# Patient Record
Sex: Female | Born: 2000 | Race: White | Hispanic: No | Marital: Single | Smoking: Never smoker
Health system: Southern US, Community
[De-identification: ages and names within clinical notes are randomized; demographics above are authoritative.]

## PROBLEM LIST (undated history)

## (undated) DIAGNOSIS — F419 Anxiety disorder, unspecified: Secondary | ICD-10-CM

## (undated) HISTORY — DX: Anxiety disorder, unspecified: F41.9

---
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
## 2020-10-05 ENCOUNTER — Telehealth: Payer: Self-pay | Admitting: Family Medicine

## 2020-10-05 NOTE — Telephone Encounter (Signed)
Mom would like to re-establish care please advise 

## 2020-10-05 NOTE — Telephone Encounter (Signed)
Ok to schedule re-est patient.  

## 2020-10-05 NOTE — Telephone Encounter (Signed)
Sure will see her

## 2020-10-05 NOTE — Telephone Encounter (Signed)
Virginia Acevedo is current patient of yours. Mother of this patient.  

## 2020-10-06 NOTE — Telephone Encounter (Signed)
lvm to schedule appt.  

## 2020-10-24 NOTE — Progress Notes (Addendum)
[facility] at [facility] [address] [place], [place] [address] [phone]  Date:  10/26/2020   Name:  Virginia Acevedo   DOB:  Jun 24, 2000   MRN:  [id number]  PCP:  Pearline Cables, MD    Chief Complaint: re-establishing care and right flank pain (Going on a couple of months. Sharp cramp like pain randomly at work. Mostly happens when she has to urinate. )   History of Present Illness:  Virginia Acevedo is a 20 y.o. very pleasant female patient who presents with the following:  Patient seen today to reestablish care, I last saw her in 2017 Her mom Carollee Herter is a current patient of mine She has history of exercise-induced asthma- she seems to have grown out of this however and no longer uses an inhaler  She is working at [employer] full time- she is saving money to go to [employer] to study finance   Review immunizations-patient would benefit from starting Gardasil, and can also receive Menveo ?  Contraception/sexual activity-she has never been sexually active, but is dating someone seriously and would like to start on contraception.  We discussed options, she is most interested in birth control pills No history of hypertension, no history of blood clots or cancer, no history of migraine headache with aura  LMP was last week She does not smoke, use drugs or drink alcohol   2-3 months ago she started to notice a pain in her right flank/ abdomen area-the pain will come and go generally within a few minutes  May occur more often when she needs to urinate- not related to menstrual cycle  No blood in her urine, no dysuria Always on the right side No vomiting or diarrhea  There are no problems to display for this patient.   Past Medical History:  Diagnosis Date   Anxiety       Social History   Tobacco Use   Smoking status: Never   Smokeless tobacco: Never  Substance Use Topics   Alcohol use: No    Alcohol/week: 0.0 standard drinks    Drug use: No    Family History  Problem Relation Age of Onset   Hypertension Mother    Diabetes Paternal Grandmother    Hypertension Maternal Grandmother    COPD Maternal Grandmother    Asthma Paternal Grandfather    Hyperlipidemia Father    Thyroid disease Father     No Known Allergies  Medication list has been reviewed and updated.  No current outpatient medications on file prior to visit.   No current facility-administered medications on file prior to visit.    Review of Systems:  As per HPI- otherwise negative.   Physical Examination: Vitals:   10/26/20 1532  BP: 104/60  Pulse: 95  Temp: 98.6 F (37 C)  SpO2: 99%   Vitals:   10/26/20 1532  Weight: 99 lb 3.2 oz (45 kg)  Height: 5\' 1"  (1.549 m)   Body mass index is 18.74 kg/m. Ideal Body Weight: Weight in (lb) to have BMI = 25: 132  GEN: no acute distress. Slim build, looks well  HEENT: Atraumatic, Normocephalic.  Bilateral TM wnl, oropharynx normal.  PEERL,EOMI.   Ears and Nose: No external deformity. CV: RRR, No M/G/R. No JVD. No thrill. No extra heart sounds. PULM: CTA B, no wheezes, crackles, rhonchi. No retractions. No resp. distress. No accessory muscle use. ABD: S, NT, ND, +BS. No rebound. No HSM.  No  CVA tenderness, belly is benign EXTR: No c/c/e PSYCH: Normally interactive. Conversant.  Results for orders placed or performed in visit on 10/26/20  POCT urine pregnancy  Result Value Ref Range   Preg Test, Ur Negative Negative  POCT urinalysis dipstick  Result Value Ref Range   Color, UA yellow yellow   Clarity, UA cloudy (A) clear   Glucose, UA negative negative mg/dL   Bilirubin, UA negative negative   Ketones, POC UA negative negative mg/dL   Spec Grav, UA 1.157 2.620 - 1.025   Blood, UA negative negative   pH, UA 7.5 5.0 - 8.0   Protein Ur, POC =30 (A) negative mg/dL   Urobilinogen, UA 0.2 0.2 or 1.0 E.U./dL   Nitrite, UA Negative Negative   Leukocytes, UA Negative Negative      Assessment and Plan: Physical exam  Right flank pain - Plan: Comprehensive metabolic panel, Urine Culture, POCT urinalysis dipstick  Screening for deficiency anemia - Plan: CBC  Screening for diabetes mellitus - Plan: Comprehensive metabolic panel  Encounter for initial prescription of contraceptive pills - Plan: POCT urine pregnancy, levonorgestrel-ethinyl estradiol (ALESSE) 0.1-20 MG-MCG tablet  Immunization due - Plan: HPV 9-valent vaccine,Recombinat, Meningococcal MCV4O(Menveo) Patient seen today for a physical and to reestablish care She is having flank pain, this could indicate a kidney stone.  We will send her urine for culture.  Assuming this is negative, can pursue imaging-CT versus ultrasound-to look for stone She would like to start on contraception.  I prescribed oral contraceptive pills, discussed with patient how to properly and safely use pills and discussed most frequent side effects.  She will contact me if any concerns  Gave first dose of Gardasil, Menactra today.  Since she is 20 one dose of Menactra is sufficient, but I did encourage 2 more doses of Gardasil Will plan further follow- up pending labs.   This visit occurred during the SARS-CoV-2 public health emergency.  Safety protocols were in place, including screening questions prior to the visit, additional usage of staff PPE, and extensive cleaning of exam room while observing appropriate contact time as indicated for disinfecting solutions.   Signed Abbe Amsterdam, MD  Addendum 6/28, received her labs as below-await urine culture Results for orders placed or performed in visit on 10/26/20  CBC  Result Value Ref Range   WBC 6.9 4.5 - 10.5 K/uL   RBC 4.45 3.87 - 5.11 Mil/uL   Platelets 272.0 150.0 - 400.0 K/uL   Hemoglobin 13.2 12.0 - 15.0 g/dL   HCT 35.5 97.4 - 16.3 %   MCV 88.3 78.0 - 100.0 fl   MCHC 33.5 30.0 - 36.0 g/dL   RDW 84.5 36.4 - 68.0 %  Comprehensive metabolic panel  Result Value Ref  Range   Sodium 139 135 - 145 mEq/L   Potassium 3.6 3.5 - 5.1 mEq/L   Chloride 103 96 - 112 mEq/L   CO2 27 19 - 32 mEq/L   Glucose, Bld 89 70 - 99 mg/dL   BUN 10 6 - 23 mg/dL   Creatinine, Ser 3.21 0.40 - 1.20 mg/dL   Total Bilirubin 0.4 0.2 - 1.2 mg/dL   Alkaline Phosphatase 61 39 - 117 U/L   AST 18 0 - 37 U/L   ALT 13 0 - 35 U/L   Total Protein 7.1 6.0 - 8.3 g/dL   Albumin 4.4 3.5 - 5.2 g/dL   GFR 224.82 >50.03 mL/min   Calcium 9.6 8.4 - 10.5 mg/dL  POCT urine pregnancy  Result Value Ref Range   Preg Test, Ur Negative Negative  POCT urinalysis dipstick  Result Value Ref Range   Color, UA yellow yellow   Clarity, UA cloudy (A) clear   Glucose, UA negative negative mg/dL   Bilirubin, UA negative negative   Ketones, POC UA negative negative mg/dL   Spec Grav, UA 1.610 9.604 - 1.025   Blood, UA negative negative   pH, UA 7.5 5.0 - 8.0   Protein Ur, POC =30 (A) negative mg/dL   Urobilinogen, UA 0.2 0.2 or 1.0 E.U./dL   Nitrite, UA Negative Negative   Leukocytes, UA Negative Negative

## 2020-10-26 ENCOUNTER — Other Ambulatory Visit: Payer: Self-pay

## 2020-10-26 ENCOUNTER — Ambulatory Visit (INDEPENDENT_AMBULATORY_CARE_PROVIDER_SITE_OTHER): Payer: BC Managed Care – PPO | Admitting: Family Medicine

## 2020-10-26 ENCOUNTER — Encounter: Payer: Self-pay | Admitting: Family Medicine

## 2020-10-26 VITALS — BP 104/60 | HR 95 | Temp 98.6°F | Ht 61.0 in | Wt 99.2 lb

## 2020-10-26 DIAGNOSIS — Z131 Encounter for screening for diabetes mellitus: Secondary | ICD-10-CM

## 2020-10-26 DIAGNOSIS — Z30011 Encounter for initial prescription of contraceptive pills: Secondary | ICD-10-CM | POA: Diagnosis not present

## 2020-10-26 DIAGNOSIS — Z23 Encounter for immunization: Secondary | ICD-10-CM

## 2020-10-26 DIAGNOSIS — Z0000 Encounter for general adult medical examination without abnormal findings: Secondary | ICD-10-CM

## 2020-10-26 DIAGNOSIS — R109 Unspecified abdominal pain: Secondary | ICD-10-CM | POA: Diagnosis not present

## 2020-10-26 DIAGNOSIS — Z130 Encounter for screening for diseases of the blood and blood-forming organs and certain disorders involving the immune mechanism: Secondary | ICD-10-CM | POA: Diagnosis not present

## 2020-10-26 LAB — POCT URINALYSIS DIP (MANUAL ENTRY)
Bilirubin, UA: NEGATIVE
Blood, UA: NEGATIVE
Glucose, UA: NEGATIVE mg/dL
Ketones, POC UA: NEGATIVE mg/dL
Leukocytes, UA: NEGATIVE
Nitrite, UA: NEGATIVE
Protein Ur, POC: 30 mg/dL — AB
Spec Grav, UA: 1.01 (ref 1.010–1.025)
Urobilinogen, UA: 0.2 E.U./dL
pH, UA: 7.5 (ref 5.0–8.0)

## 2020-10-26 LAB — POCT URINE PREGNANCY: Preg Test, Ur: NEGATIVE

## 2020-10-26 MED ORDER — LEVONORGESTREL-ETHINYL ESTRAD 0.1-20 MG-MCG PO TABS: 1.0000 | ORAL_TABLET | Freq: Every day | ORAL | 3 refills | Status: DC

## 2020-10-26 NOTE — Patient Instructions (Addendum)
It was very nice to see you today, I will be in touch with your labs and symptoms possible.  Assuming that you do not appear to have a urinary tract infection, we may want to do imaging to look for possible kidney stone.  We could do a either ultrasound or noncontrast CT-the benefit of ultrasound is that it does not involve any radiation, however a CT scan likely gives [place] more detail  You got a dose of gardasil today, and 1 dose of meningitis vaccine today.  You can start on the birth control pills now, please let me know how they work for you  I am not quite sure if you had a Gardasil in the past- if this is your first dose, you will want a 2nd dose in 1-2 months.  3rd dose is given 5- 6 months after the 2nd dose

## 2020-10-27 LAB — URINE CULTURE
MICRO NUMBER:: 12053730
Result:: NO GROWTH
SPECIMEN QUALITY:: ADEQUATE

## 2020-10-27 LAB — COMPREHENSIVE METABOLIC PANEL
ALT: 13 U/L (ref 0–35)
AST: 18 U/L (ref 0–37)
Albumin: 4.4 g/dL (ref 3.5–5.2)
Alkaline Phosphatase: 61 U/L (ref 39–117)
BUN: 10 mg/dL (ref 6–23)
CO2: 27 mEq/L (ref 19–32)
Calcium: 9.6 mg/dL (ref 8.4–10.5)
Chloride: 103 mEq/L (ref 96–112)
Creatinine, Ser: 0.8 mg/dL (ref 0.40–1.20)
GFR: 106.23 mL/min (ref >60.00)
Glucose, Bld: 89 mg/dL (ref 70–99)
Potassium: 3.6 mEq/L (ref 3.5–5.1)
Sodium: 139 mEq/L (ref 135–145)
Total Bilirubin: 0.4 mg/dL (ref 0.2–1.2)
Total Protein: 7.1 g/dL (ref 6.0–8.3)

## 2020-10-27 LAB — CBC
HCT: 39.3 % (ref 36.0–46.0)
Hemoglobin: 13.2 g/dL (ref 12.0–15.0)
MCHC: 33.5 g/dL (ref 30.0–36.0)
MCV: 88.3 fl (ref 78.0–100.0)
Platelets: 272 10*3/uL (ref 150.0–400.0)
RBC: 4.45 Mil/uL (ref 3.87–5.11)
RDW: 13 % (ref 11.5–14.6)
WBC: 6.9 10*3/uL (ref 4.5–10.5)

## 2020-10-29 ENCOUNTER — Other Ambulatory Visit: Payer: Self-pay | Admitting: Family

## 2020-10-29 DIAGNOSIS — R109 Unspecified abdominal pain: Secondary | ICD-10-CM

## 2020-10-29 NOTE — Telephone Encounter (Signed)
Spoke with patient's mother , she stated she is going to call the insurance company about pricing and etc , and then she will call our office back

## 2020-10-29 NOTE — Telephone Encounter (Signed)
Patient's mother called back after speaking with her insurance company and stated they would like to proceed with a CT to be scheduled downstairs because it will cover at 100%.

## 2020-10-30 ENCOUNTER — Ambulatory Visit (HOSPITAL_BASED_OUTPATIENT_CLINIC_OR_DEPARTMENT_OTHER)
Admission: RE | Admit: 2020-10-30 | Discharge: 2020-10-30 | Disposition: A | Discharge status: Home / Self Care | Payer: BC Managed Care – PPO | Source: Ambulatory Visit | Attending: Family | Admitting: Family

## 2020-10-30 ENCOUNTER — Other Ambulatory Visit: Payer: Self-pay

## 2020-10-30 DIAGNOSIS — R109 Unspecified abdominal pain: Secondary | ICD-10-CM | POA: Insufficient documentation

## 2020-10-30 DIAGNOSIS — N8301 Follicular cyst of right ovary: Secondary | ICD-10-CM | POA: Diagnosis not present

## 2020-10-30 DIAGNOSIS — Q433 Congenital malformations of intestinal fixation: Secondary | ICD-10-CM | POA: Diagnosis not present

## 2020-10-30 NOTE — Telephone Encounter (Signed)
Thank you Vernona Rieger!  Please feel free to route CT to me when it comes in   Thanks again

## 2020-11-04 ENCOUNTER — Encounter: Payer: Self-pay | Admitting: Family Medicine

## 2020-11-11 DIAGNOSIS — L404 Guttate psoriasis: Secondary | ICD-10-CM | POA: Diagnosis not present

## 2020-11-11 DIAGNOSIS — L249 Irritant contact dermatitis, unspecified cause: Secondary | ICD-10-CM | POA: Diagnosis not present

## 2021-02-01 ENCOUNTER — Encounter: Payer: Self-pay | Admitting: Family Medicine

## 2021-02-01 DIAGNOSIS — R1011 Right upper quadrant pain: Secondary | ICD-10-CM

## 2021-02-02 NOTE — Addendum Note (Signed)
Addended by: Abbe Amsterdam C on: 02/02/2021 05:35 PM   Modules accepted: Orders

## 2021-02-04 DIAGNOSIS — G8929 Other chronic pain: Secondary | ICD-10-CM | POA: Diagnosis not present

## 2021-02-04 DIAGNOSIS — R1011 Right upper quadrant pain: Secondary | ICD-10-CM | POA: Diagnosis not present

## 2021-02-10 ENCOUNTER — Encounter: Payer: Self-pay | Admitting: Family Medicine

## 2021-06-15 NOTE — Progress Notes (Deleted)
[facility] at [facility] [address] [place], [place] [address] [phone]  Date:  06/16/2021   Name:  Virginia Acevedo   DOB:  January 15, 2001   MRN:  [id number]  PCP:  Pearline Cables, MD    Chief Complaint: No chief complaint on file.   History of Present Illness:  Virginia Acevedo is a 21 y.o. very pleasant female patient who presents with the following:  Patient seen today with concern of abdominal pain Most recent visit with myself was in June for a physical At that time she was reestablishing care, her mom is also my patient We started on contraceptive pills at that time  At that visit she also had complaint of intermittent right flank pain for 2 to 3 months We ended up getting a CT renal stone study in July which was negative She also got a right upper quadrant ultrasound in October, also negative  Needs second dose Gardasil  There are no problems to display for this patient.   Past Medical History:  Diagnosis Date   Anxiety     *** The histories are not reviewed yet. Please review them in the "History" navigator section and refresh this SmartLink.  Social History   Tobacco Use   Smoking status: Never   Smokeless tobacco: Never  Substance Use Topics   Alcohol use: No    Alcohol/week: 0.0 standard drinks   Drug use: No    Family History  Problem Relation Age of Onset   Hypertension Mother    Diabetes Paternal Grandmother    Hypertension Maternal Grandmother    COPD Maternal Grandmother    Asthma Paternal Grandfather    Hyperlipidemia Father    Thyroid disease Father     No Known Allergies  Medication list has been reviewed and updated.  Current Outpatient Medications on File Prior to Visit  Medication Sig Dispense Refill   levonorgestrel-ethinyl estradiol (ALESSE) 0.1-20 MG-MCG tablet Take 1 tablet by mouth daily. 84 tablet 3   No current facility-administered medications on file prior to visit.     Review of Systems:  As per HPI- otherwise negative.   Physical Examination: There were no vitals filed for this visit. There were no vitals filed for this visit. There is no height or weight on file to calculate BMI. Ideal Body Weight:    GEN: no acute distress. HEENT: Atraumatic, Normocephalic.  Ears and Nose: No external deformity. CV: RRR, No M/G/R. No JVD. No thrill. No extra heart sounds. PULM: CTA B, no wheezes, crackles, rhonchi. No retractions. No resp. distress. No accessory muscle use. ABD: S, NT, ND, +BS. No rebound. No HSM. EXTR: No c/c/e PSYCH: Normally interactive. Conversant.    Assessment and Plan: ***  Signed Abbe Amsterdam, MD

## 2021-06-16 ENCOUNTER — Ambulatory Visit: Payer: BC Managed Care – PPO | Admitting: Family Medicine

## 2021-10-13 ENCOUNTER — Other Ambulatory Visit: Payer: Self-pay

## 2021-10-13 DIAGNOSIS — Z30011 Encounter for initial prescription of contraceptive pills: Secondary | ICD-10-CM

## 2021-10-13 MED ORDER — LEVONORGESTREL-ETHINYL ESTRAD 0.1-20 MG-MCG PO TABS: 1.0000 | ORAL_TABLET | Freq: Every day | ORAL | 0 refills | Status: DC

## 2021-10-15 ENCOUNTER — Other Ambulatory Visit: Payer: Self-pay | Admitting: Family Medicine

## 2021-10-15 DIAGNOSIS — Z30011 Encounter for initial prescription of contraceptive pills: Secondary | ICD-10-CM

## 2021-10-17 ENCOUNTER — Other Ambulatory Visit: Payer: Self-pay | Admitting: Family Medicine

## 2021-10-17 DIAGNOSIS — Z30011 Encounter for initial prescription of contraceptive pills: Secondary | ICD-10-CM

## 2021-10-18 ENCOUNTER — Telehealth: Payer: Self-pay | Admitting: Family Medicine

## 2021-10-18 NOTE — Telephone Encounter (Signed)
Patient notified that rx has been sent to [facility].

## 2021-10-18 NOTE — Telephone Encounter (Signed)
Carollee Herter (mother DPR OK) called stating pt would like to have her default pharmacy changed to the following:  [facility] [address] [place], [place] [address] Phone: [phone]

## 2021-11-16 DIAGNOSIS — Z309 Encounter for contraceptive management, unspecified: Secondary | ICD-10-CM | POA: Insufficient documentation

## 2021-11-16 NOTE — Progress Notes (Deleted)
[facility] at [facility] [address] [place], [place] [address] [phone]  Date:  11/22/2021   Name:  Virginia Acevedo   DOB:  02/01/2001   MRN:  [id number]  PCP:  Pearline Cables, MD    Chief Complaint: No chief complaint on file.   History of Present Illness:  Virginia Acevedo is a 21 y.o. very pleasant female patient who presents with the following:  Generally healthy young woman seen today for physical exam Most recent visit with myself about 1 year ago Previous history of exercise-induced asthma, no longer bothersome At last visit she was considering becoming sexually active, we started on birth control pills  Can give dose of Gardasil Can start Pap today? Blood work 1 year ago Patient Active Problem List   Diagnosis Date Noted   Contraceptive management 11/16/2021    Past Medical History:  Diagnosis Date   Anxiety     *** The histories are not reviewed yet. Please review them in the "History" navigator section and refresh this SmartLink.  Social History   Tobacco Use   Smoking status: Never   Smokeless tobacco: Never  Substance Use Topics   Alcohol use: No    Alcohol/week: 0.0 standard drinks of alcohol   Drug use: No    Family History  Problem Relation Age of Onset   Hypertension Mother    Diabetes Paternal Grandmother    Hypertension Maternal Grandmother    COPD Maternal Grandmother    Asthma Paternal Grandfather    Hyperlipidemia Father    Thyroid disease Father     No Known Allergies  Medication list has been reviewed and updated.  Current Outpatient Medications on File Prior to Visit  Medication Sig Dispense Refill   LESSINA-28 0.1-20 MG-MCG tablet TAKE ONE TABLET BY MOUTH ONE TIME DAILY 84 tablet 3   No current facility-administered medications on file prior to visit.    Review of Systems:  As per HPI- otherwise negative.   Physical Examination: There were no vitals filed for  this visit. There were no vitals filed for this visit. There is no height or weight on file to calculate BMI. Ideal Body Weight:    GEN: no acute distress. HEENT: Atraumatic, Normocephalic.  Ears and Nose: No external deformity. CV: RRR, No M/G/R. No JVD. No thrill. No extra heart sounds. PULM: CTA B, no wheezes, crackles, rhonchi. No retractions. No resp. distress. No accessory muscle use. ABD: S, NT, ND, +BS. No rebound. No HSM. EXTR: No c/c/e PSYCH: Normally interactive. Conversant.    Assessment and Plan: *** Physical exam.  Encouraged healthy diet and exercise routine Signed Abbe Amsterdam, MD

## 2021-11-22 ENCOUNTER — Encounter: Payer: BC Managed Care – PPO | Admitting: Family Medicine

## 2021-12-20 NOTE — Progress Notes (Unsigned)
[facility] at [facility] [address], [address] [place], [place] [address] [phone]  Date:  12/22/2021   Name:  Virginia Acevedo   DOB:  2001/03/27   MRN:  [id number]  PCP:  Pearline Cables, MD    Chief Complaint: No chief complaint on file.   History of Present Illness:  Virginia Acevedo is a 21 y.o. very pleasant female patient who presents with the following:  Patient seen today to discuss side pain-last visit with myself about 1 year ago for a physical I believe we most recently discussed this in October of last year, she had a right upper quadrant ultrasound per [facility] which was negative  Patient Active Problem List   Diagnosis Date Noted   Contraceptive management 11/16/2021    Past Medical History:  Diagnosis Date   Anxiety     *** The histories are not reviewed yet. Please review them in the "History" navigator section and refresh this SmartLink.  Social History   Tobacco Use   Smoking status: Never   Smokeless tobacco: Never  Substance Use Topics   Alcohol use: No    Alcohol/week: 0.0 standard drinks of alcohol   Drug use: No    Family History  Problem Relation Age of Onset   Hypertension Mother    Diabetes Paternal Grandmother    Hypertension Maternal Grandmother    COPD Maternal Grandmother    Asthma Paternal Grandfather    Hyperlipidemia Father    Thyroid disease Father     No Known Allergies  Medication list has been reviewed and updated.  Current Outpatient Medications on File Prior to Visit  Medication Sig Dispense Refill   LESSINA-28 0.1-20 MG-MCG tablet TAKE ONE TABLET BY MOUTH ONE TIME DAILY 84 tablet 3   No current facility-administered medications on file prior to visit.    Review of Systems:  As per HPI- otherwise negative.   Physical Examination: There were no vitals filed for this visit. There were no vitals filed for this visit. There is no height or weight on file to calculate  BMI. Ideal Body Weight:    GEN: no acute distress. HEENT: Atraumatic, Normocephalic.  Ears and Nose: No external deformity. CV: RRR, No M/G/R. No JVD. No thrill. No extra heart sounds. PULM: CTA B, no wheezes, crackles, rhonchi. No retractions. No resp. distress. No accessory muscle use. ABD: S, NT, ND, +BS. No rebound. No HSM. EXTR: No c/c/e PSYCH: Normally interactive. Conversant.    Assessment and Plan: ***  Signed Abbe Amsterdam, MD

## 2021-12-22 ENCOUNTER — Ambulatory Visit (INDEPENDENT_AMBULATORY_CARE_PROVIDER_SITE_OTHER): Payer: Self-pay | Admitting: Family Medicine

## 2021-12-22 ENCOUNTER — Ambulatory Visit (HOSPITAL_BASED_OUTPATIENT_CLINIC_OR_DEPARTMENT_OTHER)
Admission: RE | Admit: 2021-12-22 | Discharge: 2021-12-22 | Disposition: A | Discharge status: Home / Self Care | Payer: Self-pay | Source: Ambulatory Visit | Attending: Family Medicine | Admitting: Family Medicine

## 2021-12-22 ENCOUNTER — Encounter: Payer: Self-pay | Admitting: Family Medicine

## 2021-12-22 VITALS — BP 110/60 | HR 75 | Temp 97.8°F | Resp 18 | Ht 61.0 in | Wt 101.4 lb

## 2021-12-22 DIAGNOSIS — R1011 Right upper quadrant pain: Secondary | ICD-10-CM

## 2021-12-22 DIAGNOSIS — Z30011 Encounter for initial prescription of contraceptive pills: Secondary | ICD-10-CM

## 2021-12-22 MED ORDER — LEVONORGESTREL-ETHINYL ESTRAD 0.1-20 MG-MCG PO TABS: 1.0000 | ORAL_TABLET | Freq: Every day | ORAL | 3 refills | Status: AC

## 2021-12-22 MED ORDER — ALBUTEROL SULFATE HFA 108 (90 BASE) MCG/ACT IN AERS: 2.0000 | INHALATION_SPRAY | Freq: Four times a day (QID) | RESPIRATORY_TRACT | 3 refills | Status: AC | PRN

## 2021-12-22 NOTE — Patient Instructions (Signed)
Good to see you today!  I will be in touch with your labs asap We will set up an ultrasound of your gallbladder asap I also ordered a chest x-ray for you Let me know if anything is changing or getting worse Try to eat a low in fat diet until we figure out if a gallbladder problem

## 2021-12-23 ENCOUNTER — Encounter: Payer: Self-pay | Admitting: Family Medicine

## 2021-12-23 LAB — CBC
HCT: 39.1 % (ref 36.0–46.0)
Hemoglobin: 13.3 g/dL (ref 12.0–15.0)
MCHC: 34.1 g/dL (ref 30.0–36.0)
MCV: 90.1 fl (ref 78.0–100.0)
Platelets: 252 10*3/uL (ref 150.0–400.0)
RBC: 4.34 Mil/uL (ref 3.87–5.11)
RDW: 12.5 % (ref 11.5–15.5)
WBC: 5.6 10*3/uL (ref 4.0–10.5)

## 2021-12-23 LAB — COMPREHENSIVE METABOLIC PANEL
ALT: 10 U/L (ref 0–35)
AST: 16 U/L (ref 0–37)
Albumin: 4.2 g/dL (ref 3.5–5.2)
Alkaline Phosphatase: 47 U/L (ref 39–117)
BUN: 9 mg/dL (ref 6–23)
CO2: 24 mEq/L (ref 19–32)
Calcium: 9 mg/dL (ref 8.4–10.5)
Chloride: 106 mEq/L (ref 96–112)
Creatinine, Ser: 0.8 mg/dL (ref 0.40–1.20)
GFR: 105.37 mL/min (ref >60.00)
Glucose, Bld: 85 mg/dL (ref 70–99)
Potassium: 3.8 mEq/L (ref 3.5–5.1)
Sodium: 137 mEq/L (ref 135–145)
Total Bilirubin: 0.5 mg/dL (ref 0.2–1.2)
Total Protein: 7.3 g/dL (ref 6.0–8.3)

## 2021-12-24 ENCOUNTER — Telehealth (HOSPITAL_BASED_OUTPATIENT_CLINIC_OR_DEPARTMENT_OTHER): Payer: Self-pay

## 2022-01-04 ENCOUNTER — Telehealth (HOSPITAL_BASED_OUTPATIENT_CLINIC_OR_DEPARTMENT_OTHER): Payer: Self-pay

## 2022-07-09 IMAGING — CT CT RENAL STONE PROTOCOL
2 of 4 series · 16 of 46 positions shown, 18 images · non-contrast
Comparison: None.

CLINICAL DATA: Right flank pain, no hematuria or history of
urolithiasis.

EXAM:
CT ABDOMEN AND PELVIS WITHOUT CONTRAST
TECHNIQUE: Multidetector CT imaging of the abdomen and pelvis was performed
following the standard protocol without IV contrast.

[Series 2: axial st · axial · 0.62mm/px · z∈[+604,+959]mm · 13 of 79 slices shown, 15 images]
[im 4/79  soft-tissue]
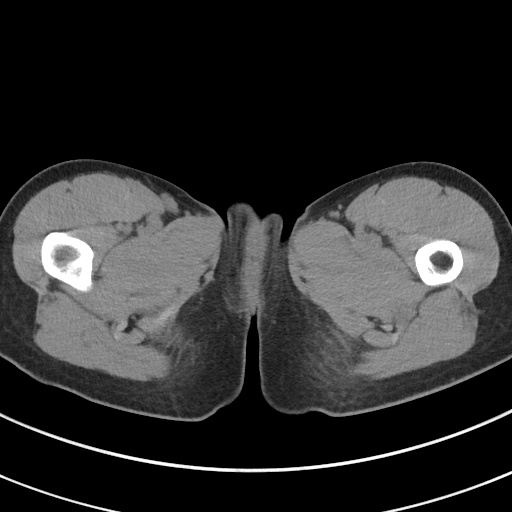
[im 4/79  bone]
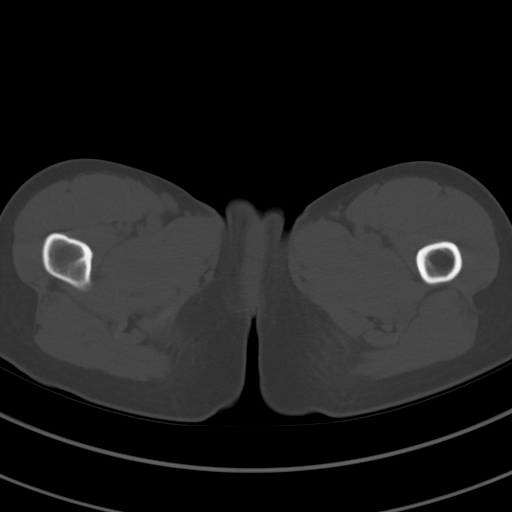
[im 10/79  soft-tissue]
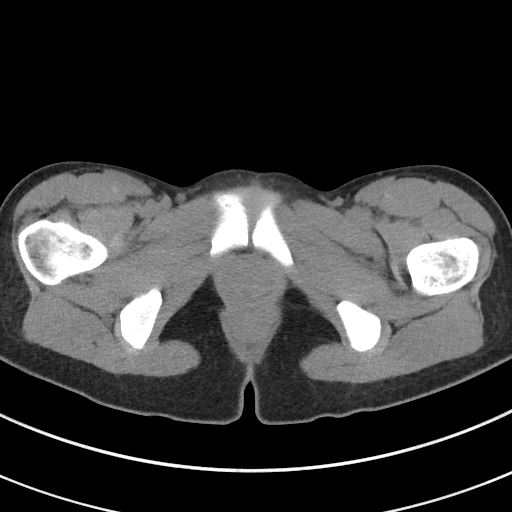
[im 16/79  soft-tissue]
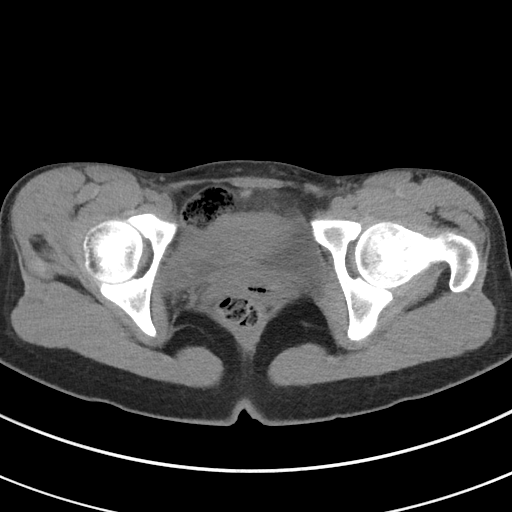
[im 22/79  soft-tissue]
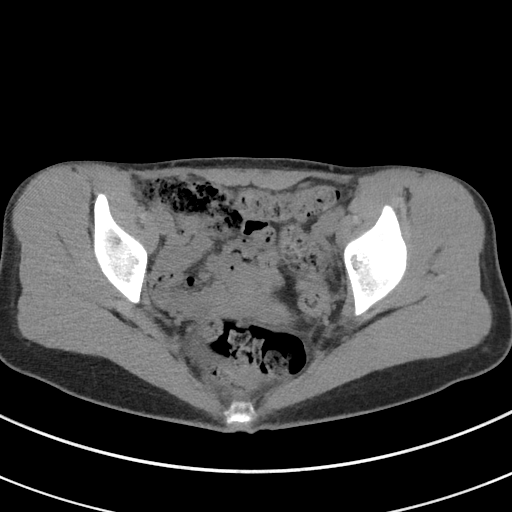
[im 29/79  soft-tissue]
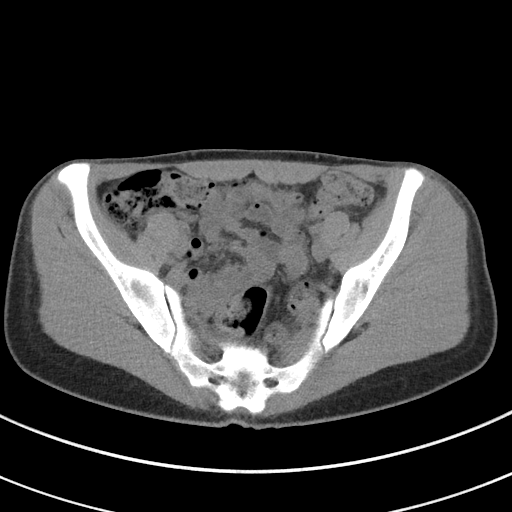
[im 35/79  soft-tissue]
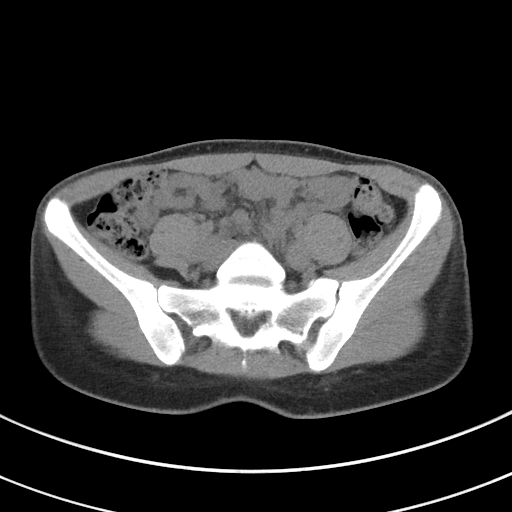
[im 41/79  soft-tissue]
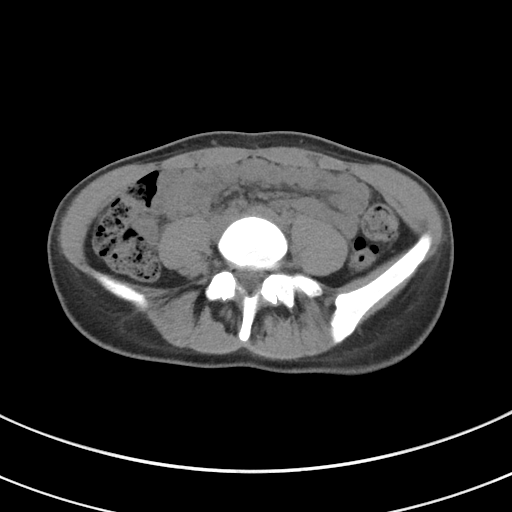
[im 44/79  soft-tissue]
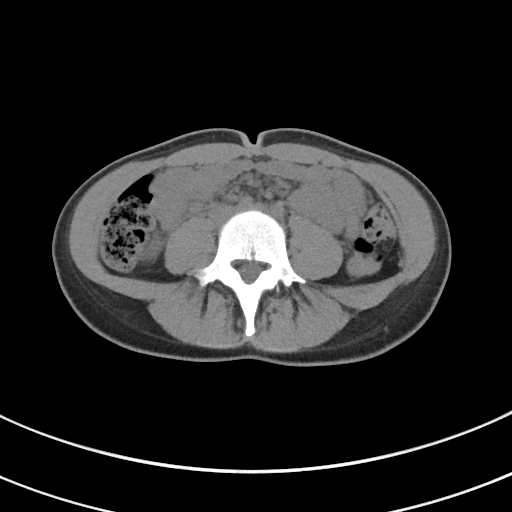
[im 50/79  soft-tissue]
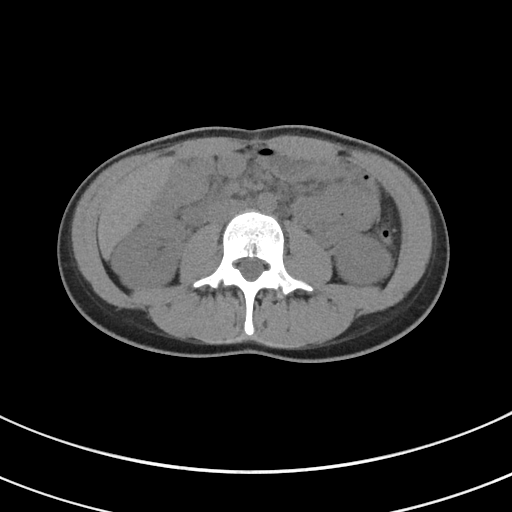
[im 50/79  bone]
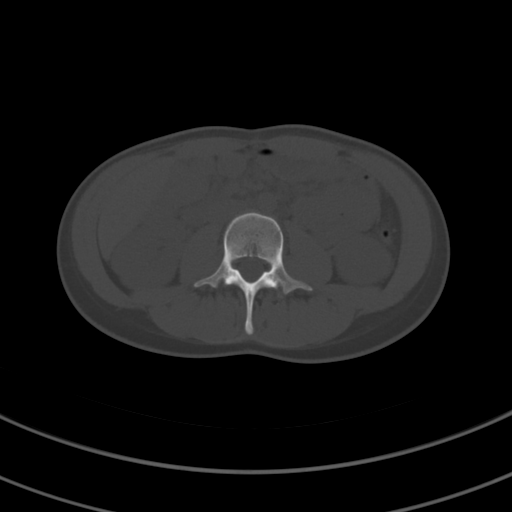
[im 57/79  soft-tissue]
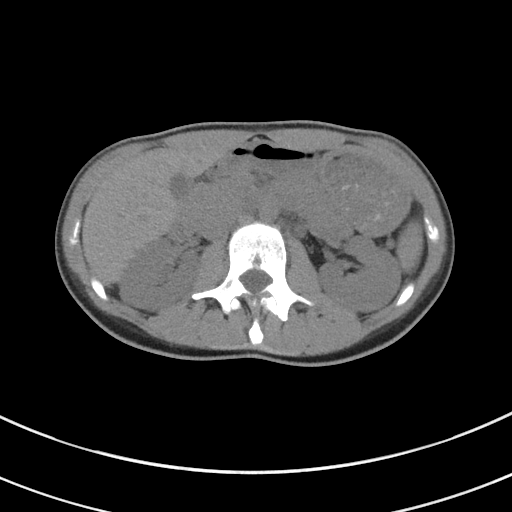
[im 63/79  soft-tissue]
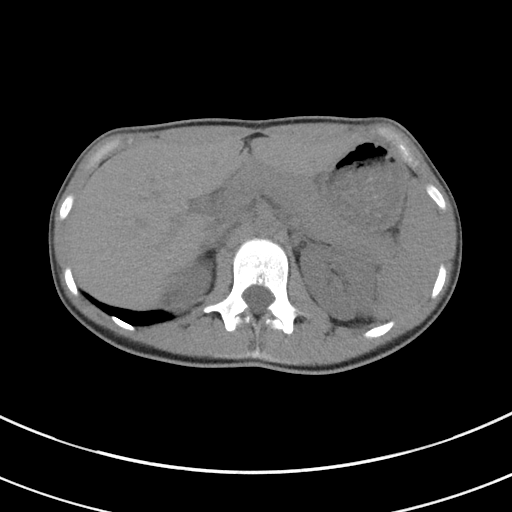
[im 69/79  soft-tissue]
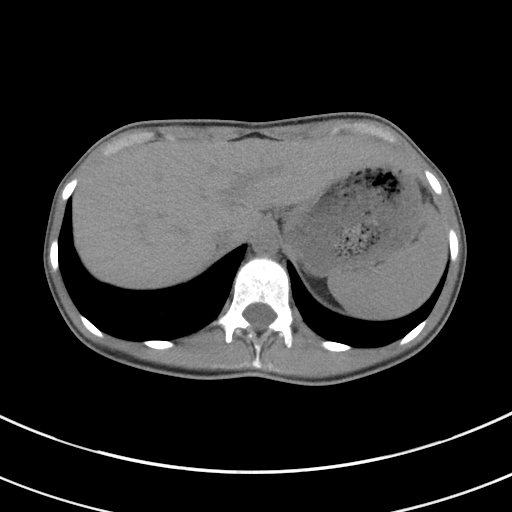
[im 75/79  soft-tissue]
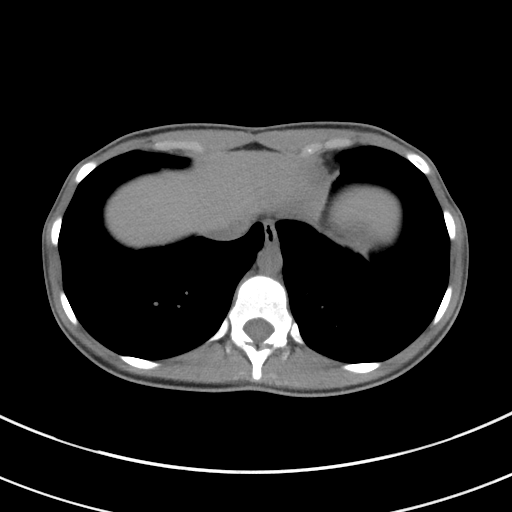

[Series 5: coronal st · coronal · 0.77mm/px · 3 of 69 slices shown]
[im 23/69  soft-tissue]
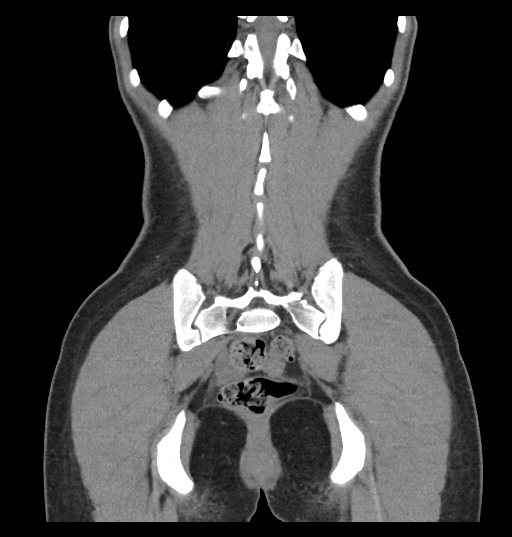
[im 31/69  soft-tissue]
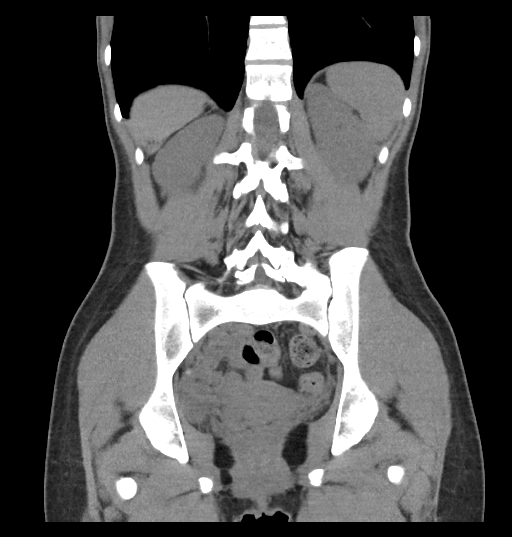
[im 38/69  soft-tissue]
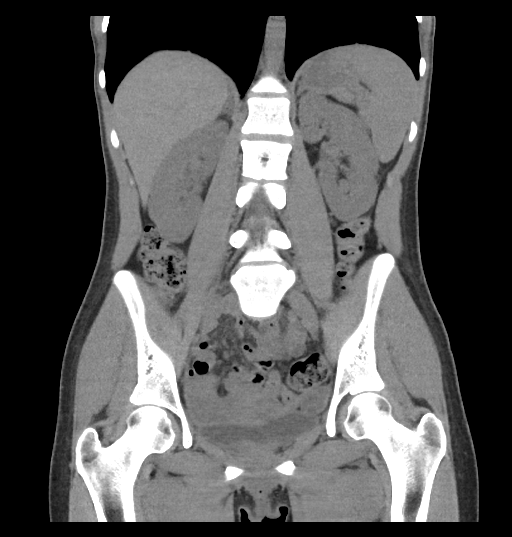

[16 of 46 positions shown; findings below may reference images not displayed]

FINDINGS: Lower chest: Lung bases are clear. Normal heart size. No pericardial
effusion. Chest wall pectus deformity (Deleon index 3.4)

Hepatobiliary: No worrisome focal liver lesions. Smooth liver
surface contour. Normal hepatic attenuation. Gallbladder and biliary
tree.

Pancreas: No pancreatic ductal dilatation or surrounding
inflammatory changes.

Spleen: Normal in size. No concerning splenic lesions.

Adrenals/Urinary Tract: Normal adrenal glands. Kidneys are symmetric
in size and normally located. No visible or contour deforming renal
lesion. Small punctate radiodensity in the proximal collecting
system of the lower pole right kidney (5/39) could reflect a small
nonobstructing uroliths or Hj Ramli plaque. No visible obstructing
urolithiasis or hydronephrosis seen this time. No gross bladder
abnormality accounting for underdistention. No visible bladder
calculi or debris.

Stomach/Bowel: Challenging assessment of the bowel and mesentery
given a paucity of fat and lack of intravenous contrast. Distal
esophagus, stomach and duodenum are unremarkable. No small bowel
thickening or dilatation. Mobile mesentery with cecum displaced
towards the midline pelvis. Normal air-filled appendix along the
right pelvic sidewall. No focal periappendiceal inflammation.
Moderate colonic stool burden. No high-grade obstruction.

Vascular/Lymphatic: No significant vascular findings are present. No
enlarged abdominal or pelvic lymph nodes.

Reproductive: Anteverted uterus. No concerning uterine mass. 1.8 cm
hypoattenuating probable dominant cyst/follicle in the right ovary.
No concerning complexity within the limitations of an unenhanced
exam. No worrisome left adnexal abnormalities.

Other: Trace low-attenuation free fluid in the deep pelvis is
nonspecific in a reproductive age female. No free air. No bowel
containing hernia.

Musculoskeletal: No acute osseous abnormality or suspicious osseous
lesion.
IMPRESSION: 1. No obstructive urolithiasis or hydronephrosis is seen at this
time.
2. Punctate radiodensity in the proximal collecting system of the
lower pole right kidney, possible nonobstructing urolith or
Hj Ramli plaque.
3. 1.8 cm hypoattenuating dominant cyst/follicle in the right
adnexa. Typical physiologic finding for which routine follow-up
imaging is not typically warranted unless there is clinical concern
given the ipsilateral symptomatology.

## 2023-03-24 ENCOUNTER — Other Ambulatory Visit: Payer: Self-pay | Admitting: Family Medicine

## 2023-03-24 DIAGNOSIS — R109 Unspecified abdominal pain: Secondary | ICD-10-CM

## 2023-04-11 ENCOUNTER — Encounter: Payer: Self-pay | Admitting: Internal Medicine

## 2023-04-14 ENCOUNTER — Ambulatory Visit
Admission: RE | Admit: 2023-04-14 | Discharge: 2023-04-14 | Disposition: A | Discharge status: Home / Self Care | Payer: Medicaid Other | Source: Ambulatory Visit | Attending: Family Medicine | Admitting: Family Medicine

## 2023-04-14 DIAGNOSIS — R109 Unspecified abdominal pain: Secondary | ICD-10-CM

## 2023-04-14 MED ORDER — IOPAMIDOL (ISOVUE-370) INJECTION 76%
500.0000 mL | Freq: Once | INTRAVENOUS | Status: AC | PRN
  Administered 2023-04-14: 80 mL via INTRAVENOUS
